# Patient Record
Sex: Male | Born: 1975 | Race: Black or African American | Hispanic: No | Marital: Married | Smoking: Never smoker
Health system: Southern US, Community
[De-identification: ages and names within clinical notes are randomized; demographics above are authoritative.]

## PROBLEM LIST (undated history)

## (undated) DIAGNOSIS — J189 Pneumonia, unspecified organism: Secondary | ICD-10-CM

## (undated) DIAGNOSIS — I10 Essential (primary) hypertension: Secondary | ICD-10-CM

## (undated) DIAGNOSIS — E119 Type 2 diabetes mellitus without complications: Secondary | ICD-10-CM

## (undated) HISTORY — PX: TONSILLECTOMY AND ADENOIDECTOMY: SUR1326

## (undated) HISTORY — PX: KNEE ARTHROSCOPY: SHX127

---

## 2020-09-20 ENCOUNTER — Encounter: Payer: Self-pay | Admitting: Emergency Medicine

## 2020-09-20 ENCOUNTER — Emergency Department (INDEPENDENT_AMBULATORY_CARE_PROVIDER_SITE_OTHER): Payer: BC Managed Care – PPO

## 2020-09-20 ENCOUNTER — Other Ambulatory Visit: Payer: Self-pay

## 2020-09-20 ENCOUNTER — Emergency Department
Admission: EM | Admit: 2020-09-20 | Discharge: 2020-09-20 | Disposition: A | Discharge status: Home / Self Care | Payer: Self-pay | Source: Home / Self Care

## 2020-09-20 DIAGNOSIS — U071 COVID-19: Secondary | ICD-10-CM

## 2020-09-20 DIAGNOSIS — J309 Allergic rhinitis, unspecified: Secondary | ICD-10-CM

## 2020-09-20 DIAGNOSIS — Z8709 Personal history of other diseases of the respiratory system: Secondary | ICD-10-CM | POA: Diagnosis not present

## 2020-09-20 DIAGNOSIS — R059 Cough, unspecified: Secondary | ICD-10-CM

## 2020-09-20 DIAGNOSIS — Z20822 Contact with and (suspected) exposure to covid-19: Secondary | ICD-10-CM

## 2020-09-20 HISTORY — DX: Pneumonia, unspecified organism: J18.9

## 2020-09-20 MED ORDER — BENZONATATE 100 MG PO CAPS: 200.0000 mg | ORAL_CAPSULE | Freq: Three times a day (TID) | ORAL | 0 refills | Status: AC

## 2020-09-20 MED ORDER — FEXOFENADINE HCL 180 MG PO TABS: 180.0000 mg | ORAL_TABLET | Freq: Every day | ORAL | 0 refills | Status: AC

## 2020-09-20 NOTE — ED Triage Notes (Signed)
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

## 2020-09-20 NOTE — ED Provider Notes (Signed)
[facility]    CSN: [id number] Arrival date & time: 09/20/20  1142      History   Chief Complaint Chief Complaint  Patient presents with  . Cough  . Covid Positive    HPI Ronald Carrillo is a 45 y.o. male.   HPI68 year old male presents with COVID-19 (positive for COVID-19 on home test on Friday, 09/18/2020) and cough for 2 days.  Patient denies fatigue, fever, or shortness of breath; however, is concerned for pneumonia.  Past Medical History:  Diagnosis Date  . Pneumonia     There are no problems to display for this patient.   Past Surgical History:  Procedure Laterality Date  . KNEE ARTHROSCOPY Left   . TONSILLECTOMY AND ADENOIDECTOMY         Home Medications    Prior to Admission medications   Medication Sig Start Date End Date Taking? Authorizing Provider  benzonatate (TESSALON) 100 MG capsule Take 2 capsules (200 mg total) by mouth 3 (three) times daily for 7 days. 09/20/20 09/27/20 Yes Trevor Iha, FNP  fexofenadine [facility] ALLERGY) 180 MG tablet Take 1 tablet (180 mg total) by mouth daily for 15 days. 09/20/20 10/05/20 Yes Trevor Iha, FNP  ascorbic acid (VITAMIN C) 500 MG tablet ascorbic acid (vitamin C) 500 mg tablet  TAKE 1 TABLET TWICE DAILY    [provider]    Family History Family History  Problem Relation Age of Onset  . Hypertension Mother   . Heart attack Father   . Diabetes Father     Social History Social History   Tobacco Use  . Smoking status: Never Smoker  . Smokeless tobacco: Never Used  Vaping Use  . Vaping Use: Never used  Substance Use Topics  . Alcohol use: Not Currently  . Drug use: Never     Allergies   Patient has no known allergies.   Review of Systems Review of Systems  Constitutional: Negative.   HENT: Negative.   Eyes: Negative.   Respiratory: Positive for cough.   Cardiovascular: Negative.   Gastrointestinal: Negative.   Genitourinary: Negative.   Musculoskeletal: Negative.    Skin: Negative.   Neurological: Negative.      Physical Exam Triage Vital Signs ED Triage Vitals  Enc Vitals Group     BP 09/20/20 1321 140/86     Pulse Rate 09/20/20 1321 85     Resp 09/20/20 1321 16     Temp 09/20/20 1321 98.7 F (37.1 C)     Temp Source 09/20/20 1321 Oral     SpO2 09/20/20 1321 98 %     Weight 09/20/20 1328 285 lb (129.3 kg)     Height 09/20/20 1328 6\' 3"  (1.905 m)     Head Circumference --      Peak Flow --      Pain Score 09/20/20 1321 4     Pain Loc --      Pain Edu? --      Excl. in GC? --    No data found.  Updated Vital Signs BP 140/86 (BP Location: Right Arm)   Pulse 85   Temp 98.7 F (37.1 C) (Oral)   Resp 16   Ht 6\' 3"  (1.905 m)   Wt 285 lb (129.3 kg)   SpO2 98%   BMI 35.62 kg/m      Physical Exam Vitals and nursing note reviewed.  Constitutional:      General: He is not in acute distress.    Appearance: Normal  appearance. He is obese. He is not ill-appearing.  HENT:     Head: Normocephalic and atraumatic.     Right Ear: Tympanic membrane and ear canal normal.     Left Ear: Tympanic membrane and ear canal normal.     Mouth/Throat:     Mouth: Mucous membranes are moist.     Pharynx: Oropharynx is clear.     Comments: Moderate clear drainage of posterior oropharynx noted Eyes:     Extraocular Movements: Extraocular movements intact.     Conjunctiva/sclera: Conjunctivae normal.     Pupils: Pupils are equal, round, and reactive to light.  Cardiovascular:     Rate and Rhythm: Normal rate and regular rhythm.     Pulses: Normal pulses.     Heart sounds: Normal heart sounds.  Pulmonary:     Effort: Pulmonary effort is normal. No respiratory distress.     Breath sounds: Normal breath sounds. No wheezing, rhonchi or rales.     Comments: No adventitious breath sounds noted Musculoskeletal:        General: Normal range of motion.     Cervical back: Normal range of motion and neck supple.  Lymphadenopathy:     Cervical: No  cervical adenopathy.  Skin:    General: Skin is warm and dry.  Neurological:     General: No focal deficit present.     Mental Status: He is alert and oriented to person, place, and time.  Psychiatric:        Mood and Affect: Mood normal.        Behavior: Behavior normal.      UC Treatments / Results  Labs (all labs ordered are listed, but only abnormal results are displayed) Labs Reviewed  COVID-19, FLU A+B NAA    EKG   Radiology DG Chest 2 View  Result Date: 09/20/2020 CLINICAL DATA:  Cough, COVID positive, history of pneumonia EXAM: CHEST - 2 VIEW COMPARISON:  None. FINDINGS: The heart size and mediastinal contours are within normal limits. Minimal diffuse interstitial pulmonary opacity. The visualized skeletal structures are unremarkable. IMPRESSION: Minimal diffuse interstitial pulmonary opacity, suggesting minimal edema or atypical infection. No focal airspace opacity. Electronically Signed   By: Lauralyn Primes M.D.   On: 09/20/2020 14:44    Procedures Procedures (including critical care time)  Medications Ordered in UC Medications - No data to display  Initial Impression / Assessment and Plan / UC Course  I have reviewed the triage vital signs and the nursing notes.  Pertinent labs & imaging results that were available during my care of the patient were reviewed by me and considered in my medical decision making (see chart for details).     MDM: 1.  COVID-19, 2.  Cough, 3.  Allergic rhinitis.  Patient discharged home, hemodynamically stable.  COVID-19/flu A&B ordered Final Clinical Impressions(s) / UC Diagnoses   Final diagnoses:  Contact with and (suspected) exposure to covid-19  Cough  COVID-19  Allergic rhinitis, unspecified seasonality, unspecified trigger     Discharge Instructions     Advised/encouraged patient may use Tessalon Perles daily, as needed for cough.  Advised patient to take Allegra 180 mg daily for the next 5 to 7 days, then as  needed.    ED Prescriptions    Medication Sig Dispense Auth. Provider   benzonatate (TESSALON) 100 MG capsule Take 2 capsules (200 mg total) by mouth 3 (three) times daily for 7 days. 30 capsule Trevor Iha, FNP   fexofenadine [facility] ALLERGY) 180 MG  tablet Take 1 tablet (180 mg total) by mouth daily for 15 days. 15 tablet Trevor Iha, FNP     PDMP not reviewed this encounter.   Trevor Iha, FNP 09/20/20 1454

## 2020-09-20 NOTE — Discharge Instructions (Addendum)
Advised/encouraged patient may use Tessalon Perles daily, as needed for cough.  Advised patient to take Allegra 180 mg daily for the next 5 to 7 days, then as needed.

## 2020-09-22 LAB — COVID-19, FLU A+B NAA
Influenza A, NAA: NOT DETECTED
Influenza B, NAA: NOT DETECTED
SARS-CoV-2, NAA: DETECTED — AB

## 2021-02-01 ENCOUNTER — Ambulatory Visit (INDEPENDENT_AMBULATORY_CARE_PROVIDER_SITE_OTHER): Payer: BC Managed Care – PPO

## 2021-02-01 ENCOUNTER — Other Ambulatory Visit: Payer: Self-pay

## 2021-02-01 ENCOUNTER — Ambulatory Visit: Payer: BC Managed Care – PPO | Admitting: Podiatry

## 2021-02-01 DIAGNOSIS — M722 Plantar fascial fibromatosis: Secondary | ICD-10-CM

## 2021-02-01 DIAGNOSIS — M79672 Pain in left foot: Secondary | ICD-10-CM | POA: Diagnosis not present

## 2021-02-01 MED ORDER — TRIAMCINOLONE ACETONIDE 10 MG/ML IJ SUSP
10.0000 mg | Freq: Once | INTRAMUSCULAR | Status: AC
  Administered 2021-02-01: 10 mg

## 2021-02-01 MED ORDER — MELOXICAM 15 MG PO TABS: 15.0000 mg | ORAL_TABLET | Freq: Every day | ORAL | 0 refills | Status: AC

## 2021-02-01 NOTE — Patient Instructions (Signed)
For instructions on how to put on your Plantar Fascial Brace, please visit [email or web address]   Plantar Fasciitis (Heel Spur Syndrome) with Rehab The plantar fascia is a fibrous, ligament-like, soft-tissue structure that spans the bottom of the foot. Plantar fasciitis is a condition that causes pain in the foot due to inflammation of the tissue. SYMPTOMS   Pain and tenderness on the underneath side of the foot.  Pain that worsens with standing or walking. CAUSES  Plantar fasciitis is caused by irritation and injury to the plantar fascia on the underneath side of the foot. Common mechanisms of injury include:  Direct trauma to bottom of the foot.  Damage to a small nerve that runs under the foot where the main fascia attaches to the heel bone.  Stress placed on the plantar fascia due to bone spurs. RISK INCREASES WITH:   Activities that place stress on the plantar fascia (running, jumping, pivoting, or cutting).  Poor strength and flexibility.  Improperly fitted shoes.  Tight calf muscles.  Flat feet.  Failure to warm-up properly before activity.  Obesity. PREVENTION  Warm up and stretch properly before activity.  Allow for adequate recovery between workouts.  Maintain physical fitness:  Strength, flexibility, and endurance.  Cardiovascular fitness.  Maintain a health body weight.  Avoid stress on the plantar fascia.  Wear properly fitted shoes, including arch supports for individuals who have flat feet.  PROGNOSIS  If treated properly, then the symptoms of plantar fasciitis usually resolve without surgery. However, occasionally surgery is necessary.  RELATED COMPLICATIONS   Recurrent symptoms that may result in a chronic condition.  Problems of the lower back that are caused by compensating for the injury, such as limping.  Pain or weakness of the foot during push-off following surgery.  Chronic inflammation, scarring, and partial or complete  fascia tear, occurring more often from repeated injections.  TREATMENT  Treatment initially involves the use of ice and medication to help reduce pain and inflammation. The use of strengthening and stretching exercises may help reduce pain with activity, especially stretches of the Achilles tendon. These exercises may be performed at home or with a therapist. Your caregiver may recommend that you use heel cups of arch supports to help reduce stress on the plantar fascia. Occasionally, corticosteroid injections are given to reduce inflammation. If symptoms persist for greater than 6 months despite non-surgical (conservative), then surgery may be recommended.   MEDICATION   If pain medication is necessary, then nonsteroidal anti-inflammatory medications, such as aspirin and ibuprofen, or other minor pain relievers, such as acetaminophen, are often recommended.  Do not take pain medication within 7 days before surgery.  Prescription pain relievers may be given if deemed necessary by your caregiver. Use only as directed and only as much as you need.  Corticosteroid injections may be given by your caregiver. These injections should be reserved for the most serious cases, because they may only be given a certain number of times.  HEAT AND COLD  Cold treatment (icing) relieves pain and reduces inflammation. Cold treatment should be applied for 10 to 15 minutes every 2 to 3 hours for inflammation and pain and immediately after any activity that aggravates your symptoms. Use ice packs or massage the area with a piece of ice (ice massage).  Heat treatment may be used prior to performing the stretching and strengthening activities prescribed by your caregiver, physical therapist, or athletic trainer. Use a heat pack or soak the injury in warm water.  SEEK IMMEDIATE MEDICAL   CARE IF:  Treatment seems to offer no benefit, or the condition worsens.  Any medications produce adverse side effects.   EXERCISES- RANGE OF MOTION (ROM) AND STRETCHING EXERCISES - Plantar Fasciitis (Heel Spur Syndrome) These exercises may help you when beginning to rehabilitate your injury. Your symptoms may resolve with or without further involvement from your physician, physical therapist or athletic trainer. While completing these exercises, remember:   Restoring tissue flexibility helps normal motion to return to the joints. This allows healthier, less painful movement and activity.  An effective stretch should be held for at least 30 seconds.  A stretch should never be painful. You should only feel a gentle lengthening or release in the stretched tissue.  RANGE OF MOTION - Toe Extension, Flexion  Sit with your right / left leg crossed over your opposite knee.  Grasp your toes and gently pull them back toward the top of your foot. You should feel a stretch on the bottom of your toes and/or foot.  Hold this stretch for 10 seconds.  Now, gently pull your toes toward the bottom of your foot. You should feel a stretch on the top of your toes and or foot.  Hold this stretch for 10 seconds. Repeat  times. Complete this stretch 3 times per day.   RANGE OF MOTION - Ankle Dorsiflexion, Active Assisted  Remove shoes and sit on a chair that is preferably not on a carpeted surface.  Place right / left foot under knee. Extend your opposite leg for support.  Keeping your heel down, slide your right / left foot back toward the chair until you feel a stretch at your ankle or calf. If you do not feel a stretch, slide your bottom forward to the edge of the chair, while still keeping your heel down.  Hold this stretch for 10 seconds. Repeat 3 times. Complete this stretch 2 times per day.   STRETCH  Gastroc, Standing  Place hands on wall.  Extend right / left leg, keeping the front knee somewhat bent.  Slightly point your toes inward on your back foot.  Keeping your right / left heel on the floor and your  knee straight, shift your weight toward the wall, not allowing your back to arch.  You should feel a gentle stretch in the right / left calf. Hold this position for 10 seconds. Repeat 3 times. Complete this stretch 2 times per day.  STRETCH  Soleus, Standing  Place hands on wall.  Extend right / left leg, keeping the other knee somewhat bent.  Slightly point your toes inward on your back foot.  Keep your right / left heel on the floor, bend your back knee, and slightly shift your weight over the back leg so that you feel a gentle stretch deep in your back calf.  Hold this position for 10 seconds. Repeat 3 times. Complete this stretch 2 times per day.  STRETCH  Gastrocsoleus, Standing  Note: This exercise can place a lot of stress on your foot and ankle. Please complete this exercise only if specifically instructed by your caregiver.   Place the ball of your right / left foot on a step, keeping your other foot firmly on the same step.  Hold on to the wall or a rail for balance.  Slowly lift your other foot, allowing your body weight to press your heel down over the edge of the step.  You should feel a stretch in your right / left calf.  Hold this   position for 10 seconds.  Repeat this exercise with a slight bend in your right / left knee. Repeat 3 times. Complete this stretch 2 times per day.   STRENGTHENING EXERCISES - Plantar Fasciitis (Heel Spur Syndrome)  These exercises may help you when beginning to rehabilitate your injury. They may resolve your symptoms with or without further involvement from your physician, physical therapist or athletic trainer. While completing these exercises, remember:   Muscles can gain both the endurance and the strength needed for everyday activities through controlled exercises.  Complete these exercises as instructed by your physician, physical therapist or athletic trainer. Progress the resistance and repetitions only as guided.  STRENGTH -  Towel Curls  Sit in a chair positioned on a non-carpeted surface.  Place your foot on a towel, keeping your heel on the floor.  Pull the towel toward your heel by only curling your toes. Keep your heel on the floor. Repeat 3 times. Complete this exercise 2 times per day.  STRENGTH - Ankle Inversion  Secure one end of a rubber exercise band/tubing to a fixed object (table, pole). Loop the other end around your foot just before your toes.  Place your fists between your knees. This will focus your strengthening at your ankle.  Slowly, pull your big toe up and in, making sure the band/tubing is positioned to resist the entire motion.  Hold this position for 10 seconds.  Have your muscles resist the band/tubing as it slowly pulls your foot back to the starting position. Repeat 3 times. Complete this exercises 2 times per day.  Document Released: 04/25/2005 Document Revised: 07/18/2011 Document Reviewed: 08/07/2008 [employer] Patient Information 2014 [employer]. 

## 2021-02-05 NOTE — Progress Notes (Signed)
Subjective:   Patient ID: Ronald Carrillo, male   DOB: 45 y.o.   MRN: [id number]   HPI 45 year old male presents the office with concerns of left heel pain which has been ongoing for about 3 months.  He is interested in a steroid injection.  Describes tightness and aching sensation.  Pain is worse when he first gets up from sitting and stands back up.  He states that as his feet "warm up" they feel better.  No radiating pain.  No other concerns.  He is going to [place] tomorrow for a conference.    Review of Systems  All other systems reviewed and are negative.  Past Medical History:  Diagnosis Date   Pneumonia     Past Surgical History:  Procedure Laterality Date   KNEE ARTHROSCOPY Left    TONSILLECTOMY AND ADENOIDECTOMY       Current Outpatient Medications:    meloxicam (MOBIC) 15 MG tablet, Take 1 tablet (15 mg total) by mouth daily., Disp: 30 tablet, Rfl: 0   ascorbic acid (VITAMIN C) 500 MG tablet, ascorbic acid (vitamin C) 500 mg tablet  TAKE 1 TABLET TWICE DAILY, Disp: , Rfl:    fexofenadine (ALLEGRA ALLERGY) 180 MG tablet, Take 1 tablet (180 mg total) by mouth daily for 15 days., Disp: 15 tablet, Rfl: 0  No Known Allergies        Objective:  Physical Exam  General: AAO x3, NAD  Dermatological: Skin is warm, dry and supple bilateral. There are no open sores, no preulcerative lesions, no rash or signs of infection present.  Vascular: Dorsalis Pedis artery and Posterior Tibial artery pedal pulses are 2/4 bilateral with immedate capillary fill time.  There is no pain with calf compression, swelling, warmth, erythema.   Neruologic: Grossly intact via light touch bilateral. Negative tinel sign.  Musculoskeletal: Tenderness to palpation along the plantar medial tubercle of the calcaneus at the insertion of plantar fascia on the left foot. There is no pain along the course of the plantar fascia within the arch of the foot. Plantar fascia appears to be intact. There is no pain with  lateral compression of the calcaneus or pain with vibratory sensation. There is no pain along the course or insertion of the achilles tendon. No other areas of tenderness to bilateral lower extremities. Muscular strength 5/5 in all groups tested bilateral. Equinus present.    Gait: Unassisted, Nonantalgic.       Assessment:   Left heel pain, Plantar fasciitis    Plan:  -Treatment options discussed including all alternatives, risks, and complications -Etiology of symptoms were discussed -X-rays were obtained and reviewed with the patient.  There is no evidence of acute fracture or stress fracture. -Steroid injection performed.  See procedure note below. -Prescribed mobic. Discussed side effects of the medication and directed to stop if any are to occur and call the office.  -Plantar fascial brace dispensed. -Discussed stretching, icing daily.  Discussed shoe modifications and good arch support.  Procedure: Injection Tendon/Ligament Discussed alternatives, risks, complications and verbal consent was obtained.  Location: Left plantar fascia at the glabrous junction; medial approach. Skin Prep: Alcohol. Injectate: 0.5cc 0.5% marcaine plain, 0.5 cc 2% lidocaine plain and, 1 cc kenalog 10. Disposition: Patient tolerated procedure well. Injection site dressed with a band-aid.  Post-injection care was discussed and return precautions discussed.   Return if symptoms worsen or fail to improve.  Vivi Barrack DPM

## 2022-02-11 ENCOUNTER — Other Ambulatory Visit: Payer: Self-pay | Admitting: Podiatry

## 2022-02-12 IMAGING — DX DG CHEST 2V
2 series · 2 of 2 positions shown · non-contrast
Comparison: None.

CLINICAL DATA: Cough, COVID positive, history of pneumonia

EXAM:
CHEST - 2 VIEW

[chest pa]
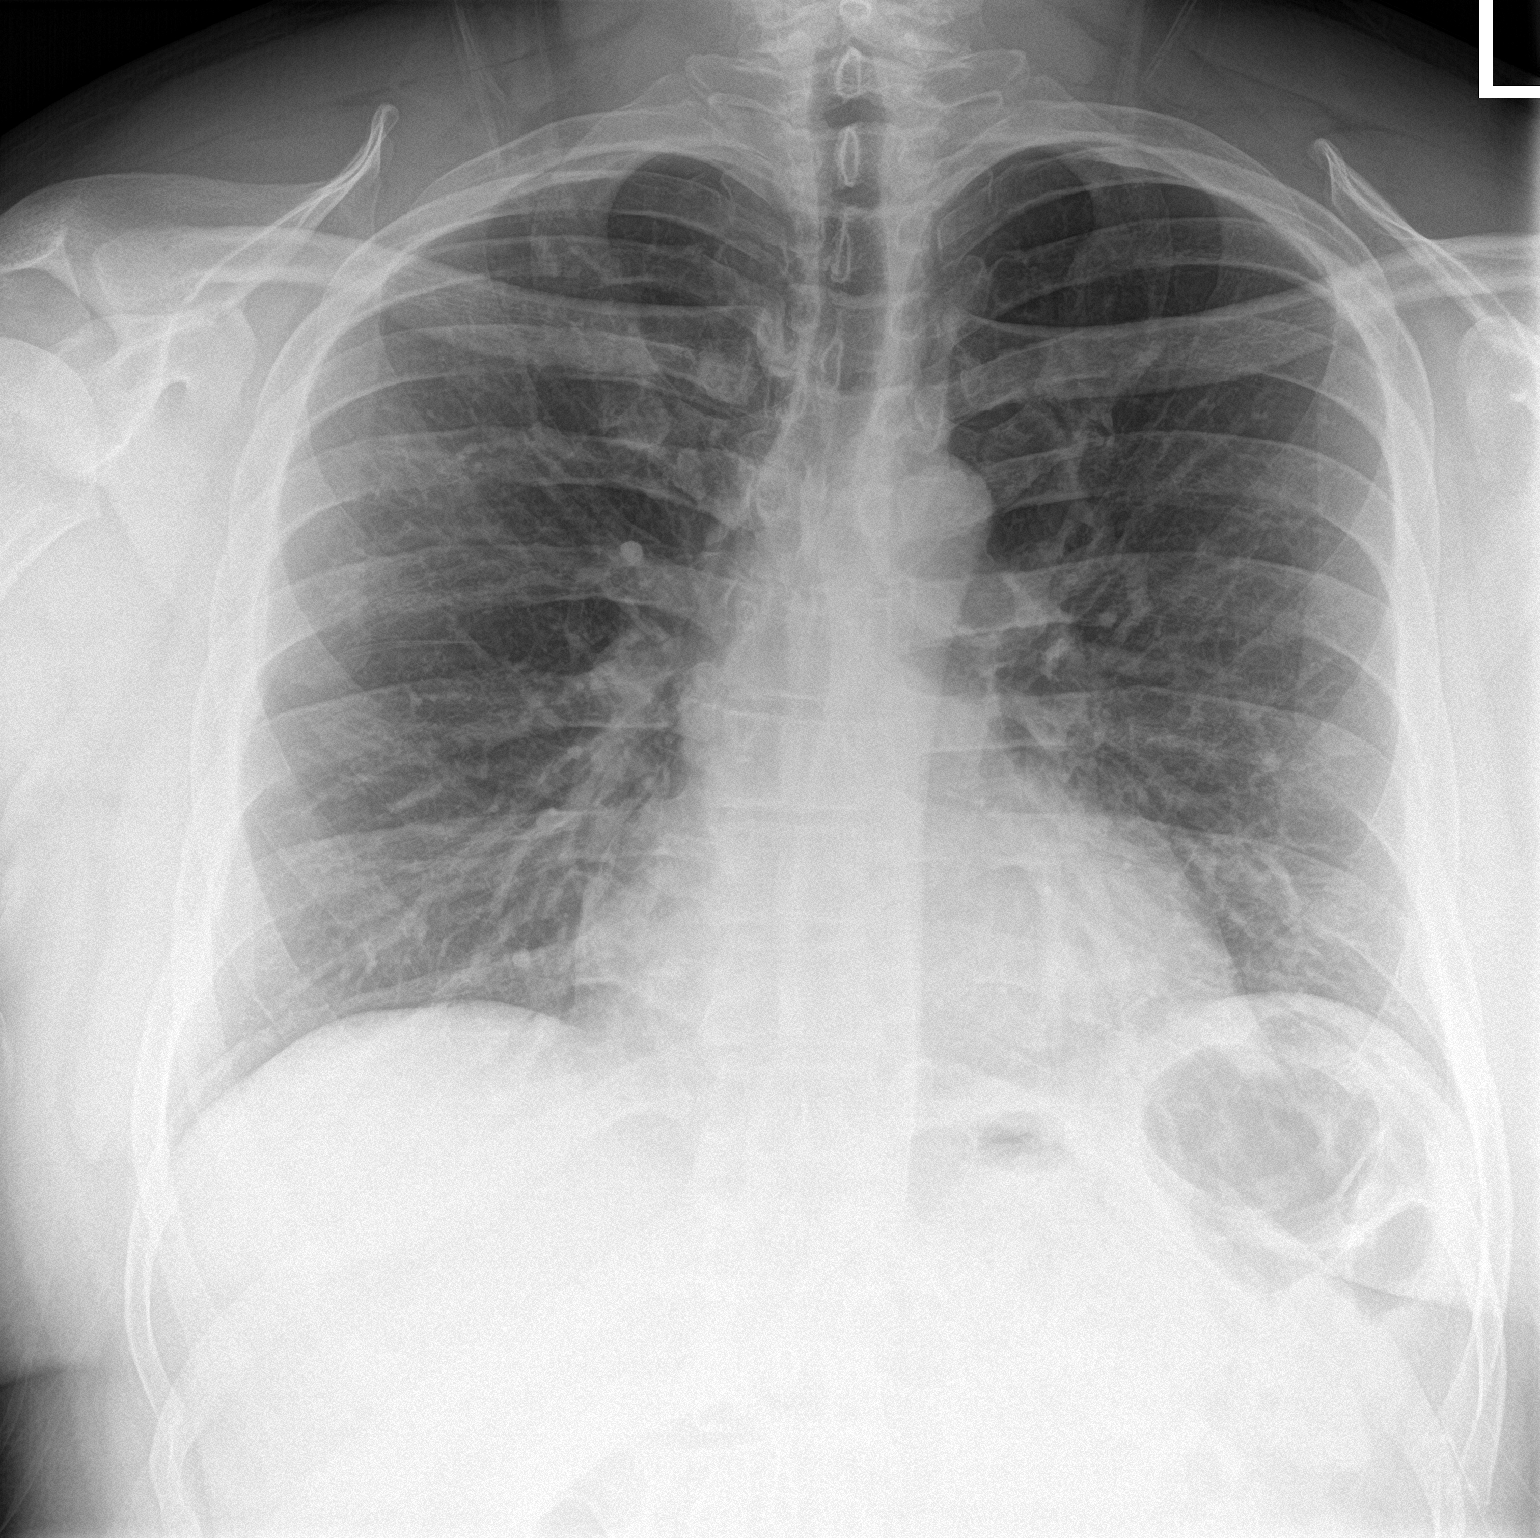

[chest lat]
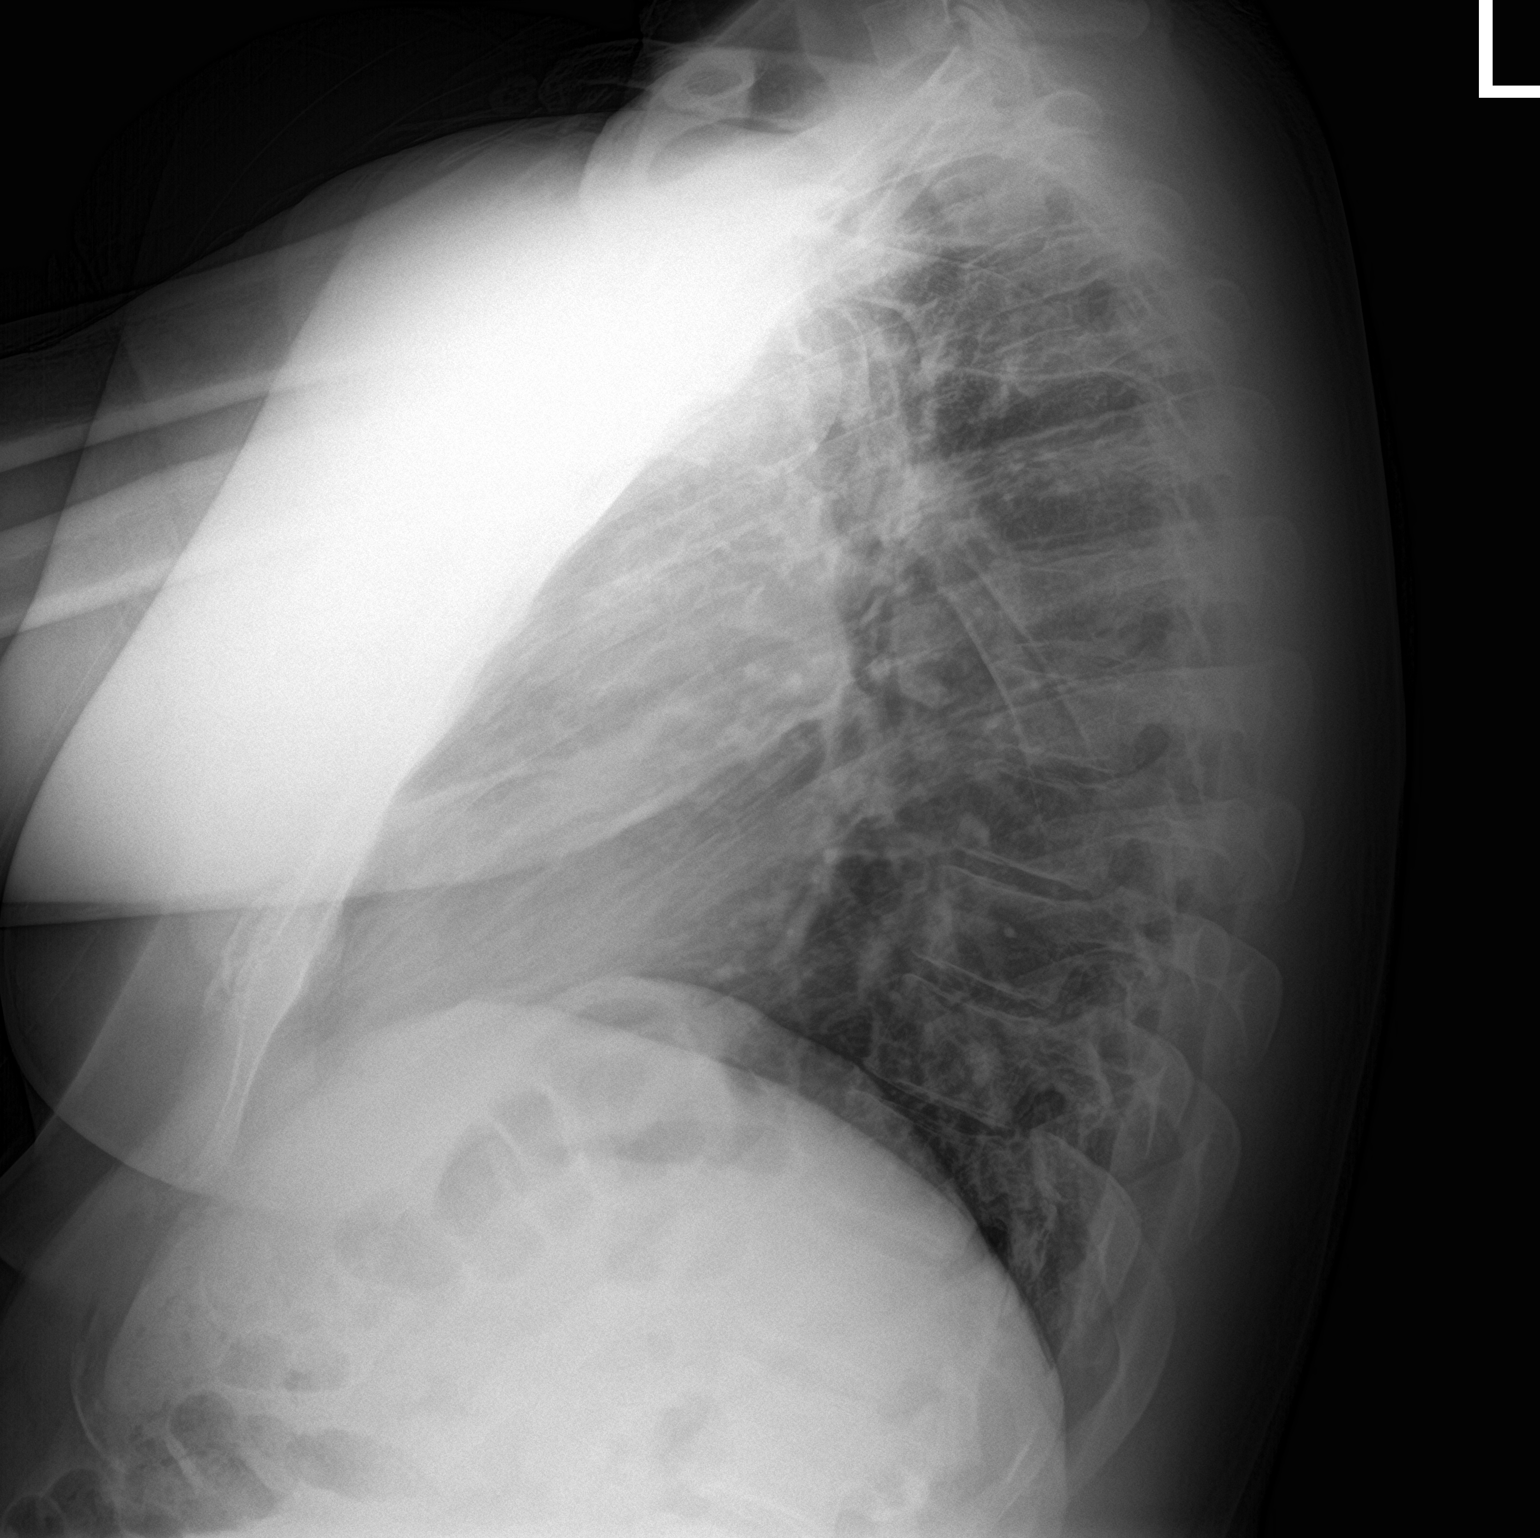

[2 of 2 positions shown; findings below may reference images not displayed]

FINDINGS: The heart size and mediastinal contours are within normal limits.
Minimal diffuse interstitial pulmonary opacity. The visualized
skeletal structures are unremarkable.
IMPRESSION: Minimal diffuse interstitial pulmonary opacity, suggesting minimal
edema or atypical infection. No focal airspace opacity.

## 2023-11-17 ENCOUNTER — Encounter (HOSPITAL_BASED_OUTPATIENT_CLINIC_OR_DEPARTMENT_OTHER): Payer: Self-pay | Admitting: Emergency Medicine

## 2023-11-17 ENCOUNTER — Emergency Department (HOSPITAL_BASED_OUTPATIENT_CLINIC_OR_DEPARTMENT_OTHER)
Admission: EM | Admit: 2023-11-17 | Discharge: 2023-11-17 | Disposition: A | Discharge status: Home / Self Care | Source: Ambulatory Visit | Attending: Emergency Medicine | Admitting: Emergency Medicine

## 2023-11-17 ENCOUNTER — Other Ambulatory Visit: Payer: Self-pay

## 2023-11-17 ENCOUNTER — Emergency Department (HOSPITAL_BASED_OUTPATIENT_CLINIC_OR_DEPARTMENT_OTHER)

## 2023-11-17 DIAGNOSIS — E119 Type 2 diabetes mellitus without complications: Secondary | ICD-10-CM | POA: Diagnosis not present

## 2023-11-17 DIAGNOSIS — I10 Essential (primary) hypertension: Secondary | ICD-10-CM | POA: Diagnosis not present

## 2023-11-17 DIAGNOSIS — M79605 Pain in left leg: Secondary | ICD-10-CM | POA: Diagnosis present

## 2023-11-17 DIAGNOSIS — I82492 Acute embolism and thrombosis of other specified deep vein of left lower extremity: Secondary | ICD-10-CM | POA: Diagnosis not present

## 2023-11-17 HISTORY — DX: Type 2 diabetes mellitus without complications: E11.9

## 2023-11-17 HISTORY — DX: Essential (primary) hypertension: I10

## 2023-11-17 LAB — CBC WITH DIFFERENTIAL/PLATELET
Abs Immature Granulocytes: 0.03 K/uL (ref 0.00–0.07)
Basophils Absolute: 0.1 K/uL (ref 0.0–0.1)
Basophils Relative: 1 %
Eosinophils Absolute: 0.2 K/uL (ref 0.0–0.5)
Eosinophils Relative: 2 %
HCT: 37.3 % — ABNORMAL LOW (ref 39.0–52.0)
Hemoglobin: 12 g/dL — ABNORMAL LOW (ref 13.0–17.0)
Immature Granulocytes: 0 %
Lymphocytes Relative: 28 %
Lymphs Abs: 2.5 K/uL (ref 0.7–4.0)
MCH: 27.7 pg (ref 26.0–34.0)
MCHC: 32.2 g/dL (ref 30.0–36.0)
MCV: 86.1 fL (ref 80.0–100.0)
Monocytes Absolute: 0.5 K/uL (ref 0.1–1.0)
Monocytes Relative: 6 %
Neutro Abs: 5.5 K/uL (ref 1.7–7.7)
Neutrophils Relative %: 63 %
Platelets: 209 K/uL (ref 150–400)
RBC: 4.33 MIL/uL (ref 4.22–5.81)
RDW: 14.2 % (ref 11.5–15.5)
WBC: 8.8 K/uL (ref 4.0–10.5)
nRBC: 0 % (ref 0.0–0.2)

## 2023-11-17 LAB — BASIC METABOLIC PANEL WITH GFR
Anion gap: 11 (ref 5–15)
BUN: 14 mg/dL (ref 6–20)
CO2: 25 mmol/L (ref 22–32)
Calcium: 9.7 mg/dL (ref 8.9–10.3)
Chloride: 105 mmol/L (ref 98–111)
Creatinine, Ser: 1.22 mg/dL (ref 0.61–1.24)
GFR, Estimated: >60 mL/min (ref >60)
Glucose, Bld: 101 mg/dL — ABNORMAL HIGH (ref 70–99)
Potassium: 4 mmol/L (ref 3.5–5.1)
Sodium: 140 mmol/L (ref 135–145)

## 2023-11-17 LAB — TROPONIN T, HIGH SENSITIVITY: Troponin T High Sensitivity: <15 ng/L (ref <19)

## 2023-11-17 MED ORDER — APIXABAN 5 MG PO TABS: ORAL_TABLET | ORAL | 0 refills | Status: AC

## 2023-11-17 MED ORDER — APIXABAN 2.5 MG PO TABS
10.0000 mg | ORAL_TABLET | Freq: Once | ORAL | Status: AC
  Administered 2023-11-17: 10 mg via ORAL
  Filled 2023-11-17: qty 4

## 2023-11-17 NOTE — ED Provider Notes (Signed)
 Received patient at signout from previous provider pending labs, troponin.  See his note.  In short, patient presents to Emergency Department for evaluation of left thigh pain that started 3 days ago.  Was seen at urgent care this morning and noted to D-dimer greater than 3.    ED workup notable for troponin WNL.  No complaints of chest pain, shortness of breath.  Low suspicion for PE.  Will provide patient with dose of Eliquis  here in emergency department and starter pack for unprovoked DVT.  Discussed that prescription is 1 month and he needs follow-up with PCP to obtain refills, reassessment.  Discussed ED workup, disposition, return to ED precautions with patient who expresses understanding agrees with plan.  All questions answered to their satisfaction.  They are agreeable to plan.  Discharge instructions provided on paperwork   Minnie Tinnie BRAVO, [place] 11/17/23 2027    Randol Simmonds, MD 11/20/23 [phone]

## 2023-11-17 NOTE — ED Provider Notes (Signed)
 [facility] AT [facility] Provider Note   CSN: [id number] Arrival date & time: 11/17/23  1619     Patient presents with: Leg Pain   Ronald Carrillo is a 48 y.o. male.   Patient with history of hypertension, diabetes presents to the emergency department today for evaluation of left lower extremity thigh pain.  Symptoms started about 3 days ago.  Patient has had some fluid on the knee in the past however his current pain is up higher in his leg.  Seen at urgent care this morning, D-dimer was greater than 3.  DVT study here positive.  Patient does not have a history of blood clots.  He flies to [place]  twice a week.  He does sit a lot with his job, but does get up from time to time.  Patient has had some intermittent shortness of breath since March, not worse over the past week.  No persistent chest pains.  No family history of blood clots.  No recent surgeries.       Prior to Admission medications   Medication Sig Start Date End Date Taking? Authorizing Provider  ascorbic acid (VITAMIN C) 500 MG tablet ascorbic acid (vitamin C) 500 mg tablet  TAKE 1 TABLET TWICE DAILY    [provider]  fexofenadine  (ALLEGRA  ALLERGY) 180 MG tablet Take 1 tablet (180 mg total) by mouth daily for 15 days. 09/20/20 10/05/20  Ragan, Michael, FNP    Allergies: Patient has no known allergies.    Review of Systems  Updated Vital Signs BP (!) 146/90 (BP Location: Right Arm)   Pulse 87   Temp 97.8 F (36.6 C)   Resp 16   SpO2 99%   Physical Exam Vitals and nursing note reviewed.  Constitutional:      General: He is not in acute distress.    Appearance: He is well-developed.  HENT:     Head: Normocephalic and atraumatic.  Eyes:     General:        Right eye: No discharge.        Left eye: No discharge.     Conjunctiva/sclera: Conjunctivae normal.  Cardiovascular:     Rate and Rhythm: Normal rate and regular rhythm.     Pulses:          Dorsalis pedis pulses  are 2+ on the right side and 2+ on the left side.     Heart sounds: Normal heart sounds.  Pulmonary:     Effort: Pulmonary effort is normal.     Breath sounds: Normal breath sounds.  Abdominal:     Palpations: Abdomen is soft.     Tenderness: There is no abdominal tenderness.  Musculoskeletal:        General: Tenderness present.     Cervical back: Normal range of motion and neck supple.  Skin:    General: Skin is warm and dry.  Neurological:     Mental Status: He is alert.     (all labs ordered are listed, but only abnormal results are displayed) Labs Reviewed  CBC WITH DIFFERENTIAL/PLATELET  BASIC METABOLIC PANEL WITH GFR  TROPONIN T, HIGH SENSITIVITY    EKG: None  Radiology: US  Venous Img Lower Unilateral Left Result Date: 11/17/2023 CLINICAL DATA:  Evaluate for DVT.  Anterior thigh pain. EXAM: LEFT a LOWER EXTREMITY VENOUS DOPPLER ULTRASOUND TECHNIQUE: Gray-scale sonography with compression, as well as color and duplex ultrasound, were performed to evaluate the deep venous system(s) from the level of the common  femoral vein through the popliteal and proximal calf veins. COMPARISON:  None Available. FINDINGS: VENOUS There is partially occlusive thrombus within the femoral vein and popliteal vein. There is limited evaluation of posterior tibial vein and peroneal vein is not visualized. Normal compressibility of the common femoral and superficial femoral veins. Limited views of the contralateral common femoral vein are unremarkable. OTHER None. Limitations: none IMPRESSION: Partially occlusive thrombus within the left femoral vein and popliteal vein. Electronically Signed   By: Greig Pique M.D.   On: 11/17/2023 17:54     Procedures   Medications Ordered in the ED - No data to display  ED Course  Patient seen and examined. History obtained directly from patient.   Labs/EKG: Ordered CBC, BMP, troponin  Imaging: DVT study results reviewed, partially occlusive DVT in the  femoral vein and popliteal vein.  Medications/Fluids: None ordered  Most recent vital signs reviewed and are as follows: BP (!) 146/90 (BP Location: Right Arm)   Pulse 87   Temp 97.8 F (36.6 C)   Resp 16   SpO2 99%   Initial impression: Left lower extremity DVT, no signs suggestive of PE.  Awaiting labs.  If troponin normal, would treat DVT with oral anticoagulation.  If troponin positive, would evaluate for PE with CTA.  Patient in agreement with plan.  He has a PCP in [place].  7:00 PM Signout to [employer] at shift change.  Plan as above.                                 Medical Decision Making Amount and/or Complexity of Data Reviewed Labs: ordered.   Patient with left lower leg pain, elevated D-dimer, DVT study positive.  Low clinical concern for PE at this time.  Awaiting lab work.  Can likely be discharged on oral anticoagulation.     Final diagnoses:  Acute deep vein thrombosis (DVT) of other specified vein of left lower extremity [facility])    ED Discharge Orders     None          Desiderio Chew, PA-C 11/17/23 1854    Randol Simmonds, MD 11/20/23 [phone]

## 2023-11-17 NOTE — Discharge Instructions (Addendum)
 Thank for let us  evaluate you today.  You have a DVT/clot in leg.  I have prescribed you Eliquis  starter pack.  Please take as prescribed.  Return to Emergency Department if you experience chest pain, shortness of breath otherwise please follow-up with primary care provider to continue Eliquis  prescription

## 2023-11-17 NOTE — ED Triage Notes (Signed)
 Left leg pain since Tuesday Upper leg pain Seen at [facility]  Ddimer elevated 3.86 Reports some sob  Recent plane travel ( frequent flyer)
